# Patient Record
Sex: Female | Born: 1990 | Race: White | Hispanic: No | Marital: Single
Health system: Southern US, Community
[De-identification: ages and names within clinical notes are randomized; demographics above are authoritative.]

---
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
## 2013-01-09 ENCOUNTER — Ambulatory Visit: Payer: BC Managed Care – PPO

## 2013-01-09 ENCOUNTER — Ambulatory Visit (INDEPENDENT_AMBULATORY_CARE_PROVIDER_SITE_OTHER): Payer: BC Managed Care – PPO | Admitting: Family Medicine

## 2013-01-09 VITALS — BP 100/78 | HR 78 | Temp 98.0°F | Resp 16 | Ht 67.0 in | Wt 130.0 lb

## 2013-01-09 DIAGNOSIS — L089 Local infection of the skin and subcutaneous tissue, unspecified: Secondary | ICD-10-CM

## 2013-01-09 DIAGNOSIS — M79661 Pain in right lower leg: Secondary | ICD-10-CM

## 2013-01-09 DIAGNOSIS — S86899A Other injury of other muscle(s) and tendon(s) at lower leg level, unspecified leg, initial encounter: Secondary | ICD-10-CM

## 2013-01-09 DIAGNOSIS — IMO0002 Reserved for concepts with insufficient information to code with codable children: Secondary | ICD-10-CM

## 2013-01-09 DIAGNOSIS — M79609 Pain in unspecified limb: Secondary | ICD-10-CM

## 2013-01-09 MED ORDER — SULFAMETHOXAZOLE-TRIMETHOPRIM 800-160 MG PO TABS: 1.0000 | ORAL_TABLET | Freq: Two times a day (BID) | ORAL | Status: DC

## 2013-01-09 NOTE — Patient Instructions (Addendum)
Medial Tibial Stress Syndrome (Shin Splints) with Rehab Medial tibial stress syndrome is also called shin splints. Shin splints is a term that is broadly used to describe pain in the lower leg. Shin splints most commonly involve inflammation of the bone lining (periostitis). SYMPTOMS   Pain in the front, or more commonly, the inner part of the lower half of the leg (shin), above the ankle.  Pain that first occurs after exercise, and eventually progresses to pain at the beginning of exercise, that decreases after a short warm up period.  With continued exercise and if left untreated, constant pain that eventually causes the athlete to stop playing sports. CAUSES  Shin splints are an overuse injury, in which the bone lining (periosteum) is broken down at a faster rate than it can be repaired. This leads to inflammation of the periosteum and pain.  RISK INCREASES WITH:  Weakness or imbalance of the muscles of the leg and calf.  Poor strength and flexibility. Failure to warm up properly before activity.  Sports that require repetitive loading or running (marathon running, soccer, walking, jogging), especially on uneven ground or hard surfaces (concrete).  Lack of conditioning, early in the season or practice.  Poor running technique.  Flat feet.  Sudden change in activity intensity, frequency, or duration. PREVENTION  Warm up and stretch properly before activity.  Allow for adequate recovery between workouts.  Maintain physical fitness:  Strength, flexibility, and endurance.  Cardiovascular fitness.  Ensure properly fitted and cushioned shoes.  Wear cushioned arch supports.  Learn and use proper technique and have a coach correct improper technique.  Increase activity gradually.  Run on surfaces that absorb shock, such as grass, composite track, or sand (beach). PROGNOSIS  If treated properly with a slow return to activity, shin splints usually heal within 2 to 8 weeks.    RELATED COMPLICATIONS   Recurring symptoms, that result in a chronic problem.  Longer healing time, if not properly treated or if not given enough time to heal.  Altered level of performance or need to end sports participation, due to pain if activity is continued without treatment. TREATMENT Treatment first involves the use of ice and medicine, to reduce pain and inflammation. The use of strengthening and stretching exercises may help reduce pain with activity. These exercises may be performed at home or with a therapist. For individuals with flat feet, the use of arch supports (orthotics) may be helpful. Sometimes, taping, casting, or bracing the leg may be advised. Slow return to activity is allowed after pain is gone. Rarely, surgery is attempted to remove the chronically inflamed tissue.  MEDICATION  If pain medicine is needed, nonsteroidal anti-inflammatory medicines (aspirin and ibuprofen), or other minor pain relievers (acetaminophen), are often advised.  Do not take pain medicine for 7 days before surgery.  Prescription pain relievers may be given, if your caregiver thinks they are needed. Use only as directed and only as much as you need.  Ointments applied to the skin may be helpful. HEAT AND COLD  Cold treatment (icing) should be applied for 10 to 15 minutes every 2 to 3 hours for inflammation and pain, and immediately after activity that aggravates your symptoms. Use ice packs or an ice massage.  Heat treatment may be used before performing stretching and strengthening activities prescribed by your caregiver, physical therapist, or athletic trainer. Use a heat pack or a warm water soak. SEEK MEDICAL CARE IF:   Symptoms get worse or do not improve in 4   Heat treatment may be used before performing stretching and strengthening activities prescribed by your caregiver, physical therapist, or athletic trainer. Use a heat pack or a warm water soak.  SEEK MEDICAL CARE IF:   · Symptoms get worse or do not improve in 4 to 6 weeks, despite treatment.  · New, unexplained symptoms develop. (Drugs used in treatment may produce side effects.)  EXERCISES  RANGE OF MOTION (ROM) AND STRETCHING EXERCISES - Medial Tibial Stress Syndrome (Shin  Splints)  These exercises may help you when beginning to rehabilitate your injury. Your symptoms may resolve with or without further involvement from your physician, physical therapist or athletic trainer. While completing these exercises, remember:   · Restoring tissue flexibility helps normal motion to return to the joints. This allows healthier, less painful movement and activity.  · An effective stretch should be held for at least 30 seconds.  · A stretch should never be painful. You should only feel a gentle lengthening or release in the stretched tissue.  STRETCH  Gastroc, Standing  · Place your hands on a wall.  · Extend your right / left leg behind you, keeping the front knee somewhat bent.  · Slightly point your toes inward on your back foot.  · Keeping your right / left heel on the floor and your knee straight, shift your weight toward the wall, not allowing your back to arch.  · You should feel a gentle stretch in the right / left calf. Hold this position for __________ seconds.  Repeat __________ times. Complete this stretch __________ times per day.  STRETCH  Soleus, Standing   · Place your hands on a wall.  · Extend your right / left leg behind you, keeping the other knee somewhat bent.  · Slightly point your toes inward on your back foot.  · Keep your right / left heel on the floor, bend your back knee, and slightly shift your weight over the back leg so that you feel a gentle stretch deep in your back calf.  · Hold this position for __________ seconds.  Repeat __________ times. Complete this stretch __________ times per day.  STRETCH  Gastrocsoleus, Standing   Note: This exercise can place a lot of stress on your foot and ankle. Please complete this exercise only if specifically instructed by your caregiver.   · Place the ball of your right / left foot on a step, keeping your other foot firmly on the same step.  · Hold on to the wall or a rail for balance.  · Slowly lift your other foot, allowing  your body weight to press your heel down over the edge of the step.  · You should feel a stretch in your right / left calf.  · Hold this position for __________ seconds.  · Repeat this exercise with a slight bend in your right / left knee.  Repeat __________ times. Complete this stretch __________ times per day.   RANGE OF MOTION - Ankle Eversion   · Sit with your right / left ankle crossed over your opposite knee.  · Grip your foot with your opposite hand, placing your thumb on the top of your foot and your fingers across the bottom of your foot.  · Gently push your foot downward with a slight rotation so your littlest toes rise slightly toward the ceiling.  · You should feel a gentle stretch on the inside of your ankle. Hold the stretch for __________ seconds.  Repeat __________ times. Complete   this exercise __________ times per day.   RANGE OF MOTION - Ankle Inversion  · Sit with your right / left ankle crossed over your opposite knee.  · Grip your foot with your opposite hand, placing your thumb on the bottom of your foot and your fingers across the top of your foot.  · Gently pull your foot so the smallest toe comes toward you and your thumb pushes the inside of the ball of your foot away from you.  · You should feel a gentle stretch on the outside of your ankle. Hold the stretch for __________ seconds.  Repeat __________ times. Complete this exercise __________ times per day.   RANGE OF MOTION- Ankle Plantar Flexion   · Sit with your right / left leg crossed over your opposite knee.  · Use your opposite hand to pull the top of your foot and toes toward you.  · You should feel a gentle stretch on the top of your foot and ankle. Hold this position for __________ seconds.  Repeat __________ times. Complete __________ times per day.   STRENGTHENING EXERCISES - Medial Tibial Stress Syndrome (Shin Splints)  These exercises may help you when beginning to rehabilitate your injury. They may resolve your symptoms with  or without further involvement from your physician, physical therapist or athletic trainer. While completing these exercises, remember:   · Muscles can gain both the endurance and the strength needed for everyday activities through controlled exercises.  · Complete these exercises as instructed by your physician, physical therapist or athletic trainer. Increase the resistance and repetitions only as guided by your caregiver.  STRENGTH - Dorsiflexors  · Secure a rubber exercise band or tubing to a fixed object (table, pole) and loop the other end around your right / left foot.  · Sit on the floor facing the fixed object. The band should be slightly tense when your foot is relaxed.  · Slowly draw your foot back toward you, using your ankle and toes.  · Hold this position for __________ seconds. Slowly release the tension in the band, return your foot to the starting position.  Repeat __________ times. Complete this exercise __________ times per day.   STRENGTH - Towel Curls  · Sit in a chair, on a non-carpeted surface.  · Place your foot on a towel, keeping your heel on the floor.  · Pull the towel toward your heel only by curling your toes. Keep your heel on the floor.  · If instructed by your physician, physical therapist or athletic trainer, you may add weight at the end of the towel.  Repeat __________ times. Complete this exercise __________ times per day.  STRENGTH - Ankle Inversion  · Secure one end of a rubber exercise band or tubing to a fixed object (table, pole). Loop the other end around your foot, just before your toes.  · Place your fists between your knees. This will focus your strengthening at your ankle.  · Slowly, pull your big toe up and in, making sure the band is positioned to resist the entire motion.  · Hold this position for __________ seconds.  · Have your muscles resist the band, as it slowly pulls your foot back to the starting position.  Repeat __________ times. Complete this exercises  __________ times per day.   Document Released: 04/29/2005 Document Revised: 07/22/2011 Document Reviewed: 08/11/2008  Exit[employer]® Patient Information ©2014 [employer].
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
## 2013-05-14 ENCOUNTER — Ambulatory Visit (INDEPENDENT_AMBULATORY_CARE_PROVIDER_SITE_OTHER): Payer: BC Managed Care – PPO | Admitting: Family Medicine

## 2013-05-14 ENCOUNTER — Encounter: Payer: Self-pay | Admitting: Family Medicine

## 2013-05-14 VITALS — BP 110/62 | HR 75 | Temp 97.9°F | Resp 16 | Ht 66.0 in | Wt 122.6 lb

## 2013-05-14 DIAGNOSIS — M6740 Ganglion, unspecified site: Secondary | ICD-10-CM

## 2013-05-14 DIAGNOSIS — M67431 Ganglion, right wrist: Secondary | ICD-10-CM

## 2013-05-14 NOTE — Progress Notes (Signed)
   Subjective:    Patient ID: Allison Haney, female    DOB: 11/18/1990, 23 y.o.   MRN: [id number] Chief Complaint  Patient presents with  . knot in right hand 05/10/13    HPI  Several days ago noticed a little knot on her wrist that had just popped up. Her mom suspected it was a ganglion cyst but w/in an hour it had doubled in size.  Since then - over the past 4d, unchanged. No pain or discomfort. No restriction in wrist use or ROM. Plays soccer, track/cross country - does do occ weight lifting/push-ups but other than that no sig strenuous use.  Has never had anything similar prior.  Did not get flu shot this yr, declines today.  Home from college on winter break - brought in by mom.  No past medical history on file. No current outpatient prescriptions on file prior to visit.   No current facility-administered medications on file prior to visit.   No Known Allergies  Review of Systems  Constitutional: Negative for fever, chills and activity change.  Musculoskeletal: Positive for joint swelling. Negative for arthralgias, back pain, gait problem and myalgias.  Skin: Negative for color change, pallor, rash and wound.  Neurological: Negative for weakness and numbness.  Hematological: Negative for adenopathy. Does not bruise/bleed easily.      BP 110/62  Pulse 75  Temp(Src) 97.9 F (36.6 C) (Oral)  Resp 16  Ht 5\' 6"  (1.676 m)  Wt 122 lb 9.6 oz (55.611 kg)  BMI 19.80 kg/m2  SpO2 100%  LMP 04/19/2013 Objective:   Physical Exam  Constitutional: She is oriented to person, place, and time. She appears well-developed and well-nourished. No distress.  HENT:  Head: Normocephalic and atraumatic.  Right Ear: External ear normal.  Eyes: Conjunctivae are normal. No scleral icterus.  Pulmonary/Chest: Effort normal.  Musculoskeletal:       Right wrist: She exhibits swelling, effusion and deformity. She exhibits normal range of motion, no tenderness, no bony tenderness and no crepitus.      Right forearm: Normal.       Right hand: Normal.  On radial aspect of extensor wrist has soft compressible 1 cm mass, non-mobile, non-tender, no overlying skin changes, no warmth or induration.  Neurological: She is alert and oriented to person, place, and time.  Skin: Skin is warm and dry. She is not diaphoretic. No erythema.  Psychiatric: She has a normal mood and affect. Her behavior is normal.       Assessment & Plan:  Ganglion cyst of wrist, right Reviewed treatment options of watchful waiting - may go away on its own.  Try ice and wearing wrist brace if lifting weights or doing push-ups for compression.  If increases in size, causes any pain, or starts to limit wrist ROM rec RTC for aspiration and cons steroid inj. If bothers her at school ok to call and we could always try to get referral to hand surgeon in her area.  Pt agrees w/ conservative management for now since just started and asymptomatic - watchful waiting.   Norberto SorensonEva Ermin Parisien, MD MPH

## 2013-05-14 NOTE — Patient Instructions (Signed)
Ganglion Cyst °A ganglion cyst is a noncancerous, fluid-filled lump that occurs near joints or tendons. The ganglion cyst grows out of a joint or the lining of a tendon. It most often develops in the hand or wrist but can also develop in the shoulder, elbow, hip, knee, ankle, or foot. The round or oval ganglion can be pea sized or larger than a grape. Increased activity may enlarge the size of the cyst because more fluid starts to build up.  °CAUSES  °It is not completely known what causes a ganglion cyst to grow. However, it may be related to: °· Inflammation or irritation around the joint. °· An injury. °· Repetitive movements or overuse. °· Arthritis. °SYMPTOMS  °A lump most often appears in the hand or wrist, but can occur in other areas of the body. Generally, the lump is painless without other symptoms. However, sometimes pain can be felt during activity or when pressure is applied to the lump. The lump may even be tender to the touch. Tingling, pain, numbness, or muscle weakness can occur if the ganglion cyst presses on a nerve. Your grip may be weak and you may have less movement in your joints.  °DIAGNOSIS  °Ganglion cysts are most often diagnosed based on a physical exam, noting where the cyst is and how it looks. Your caregiver will feel the lump and may shine a light alongside it. If it is a ganglion, a light often shines through it. Your caregiver may order an X-ray, ultrasound, or MRI to rule out other conditions. °TREATMENT  °Ganglions usually go away on their own without treatment. If pain or other symptoms are involved, treatment may be needed. Treatment is also needed if the ganglion limits your movement or if it gets infected. Treatment options include: °· Wearing a wrist or finger brace or splint. °· Taking anti-inflammatory medicine. °· Draining fluid from the lump with a needle (aspiration). °· Injecting a steroid into the joint. °· Surgery to remove the ganglion cyst and its stalk that is  attached to the joint or tendon. However, ganglion cysts can grow back. °HOME CARE INSTRUCTIONS  °· Do not press on the ganglion, poke it with a needle, or hit it with a heavy object. You may rub the lump gently and often. Sometimes fluid moves out of the cyst. °· Only take medicines as directed by your caregiver. °· Wear your brace or splint as directed by your caregiver. °SEEK MEDICAL CARE IF:  °· Your ganglion becomes larger or more painful. °· You have increased redness, red streaks, or swelling. °· You have pus coming from the lump. °· You have weakness or numbness in the affected area. °MAKE SURE YOU:  °· Understand these instructions. °· Will watch your condition. °· Will get help right away if you are not doing well or get worse. °Document Released: 04/26/2000 Document Revised: 01/22/2012 Document Reviewed: 06/23/2007 °Exit[employer]® Patient Information ©2014 [employer]. ° °

## 2014-04-13 IMAGING — CR DG TIBIA/FIBULA 2V*R*
2 series · 2 of 2 positions shown · non-contrast
Comparison: None

CLINICAL DATA: Leg injury

RIGHT TIBIA AND FIBULA - 2 VIEW

[AP]
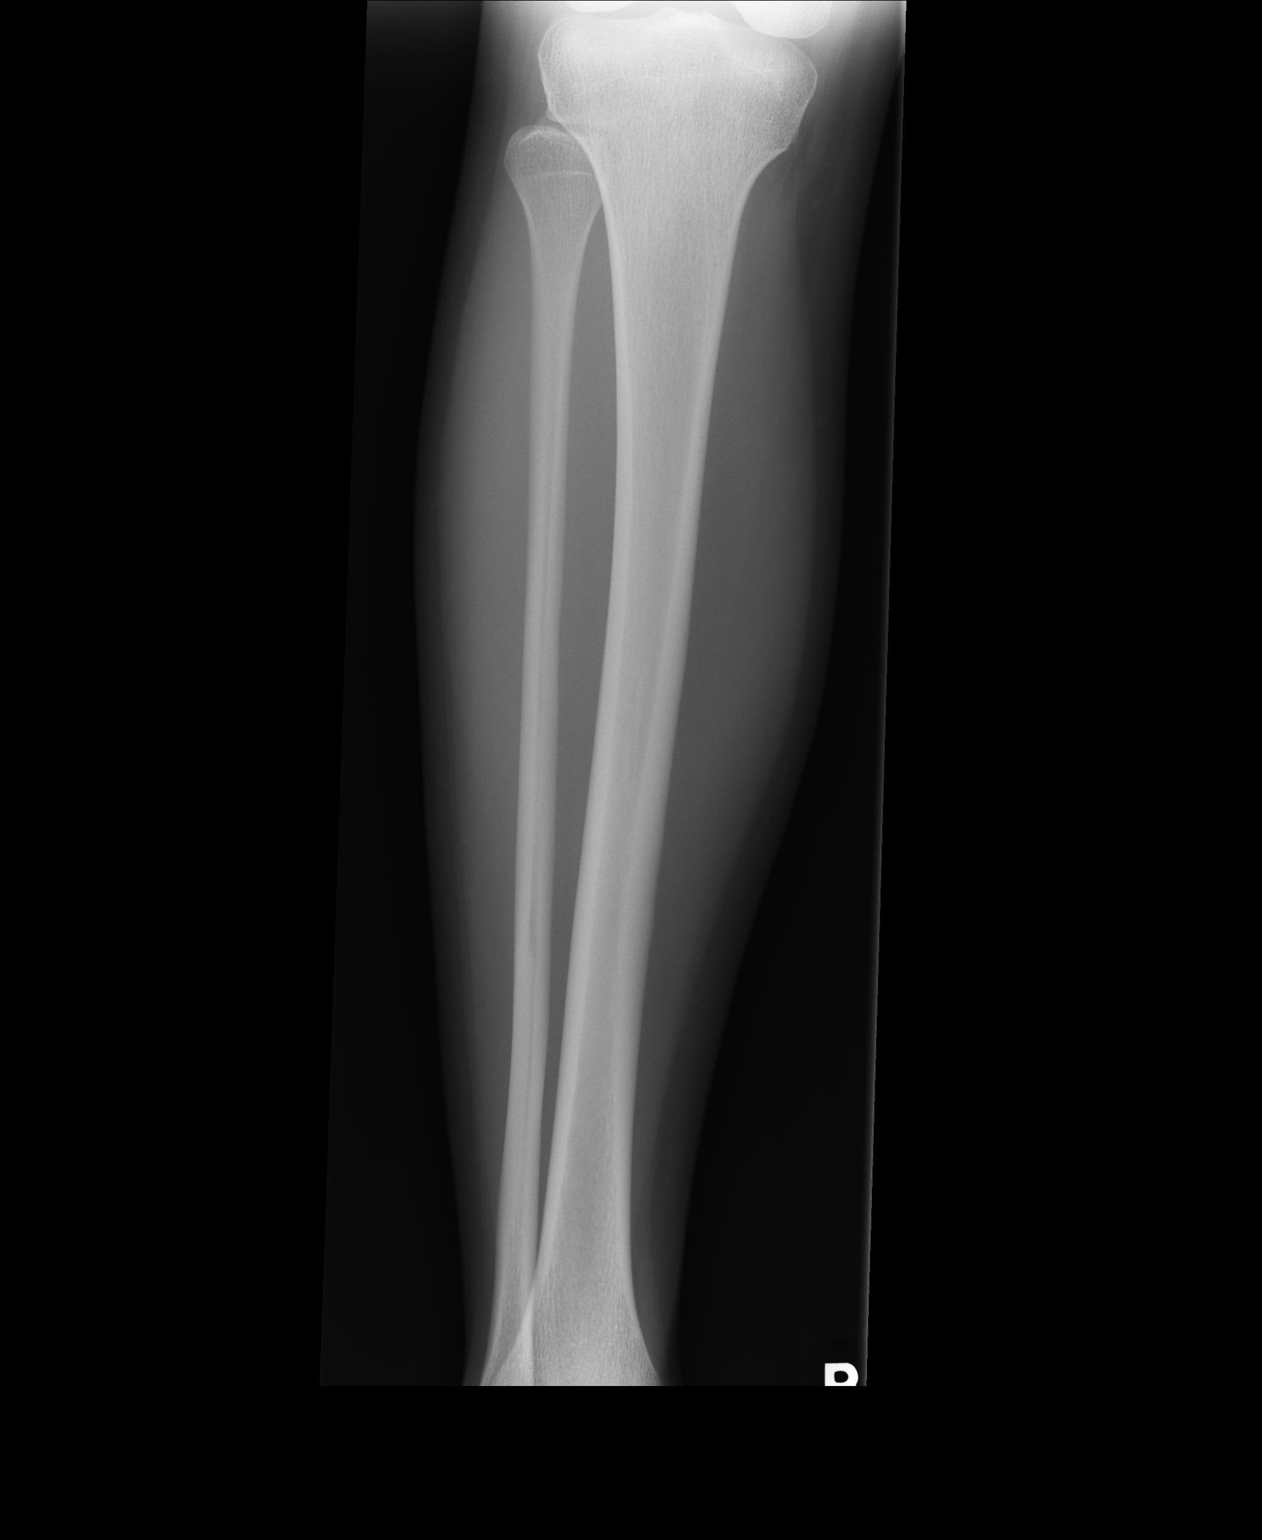

[lateral]
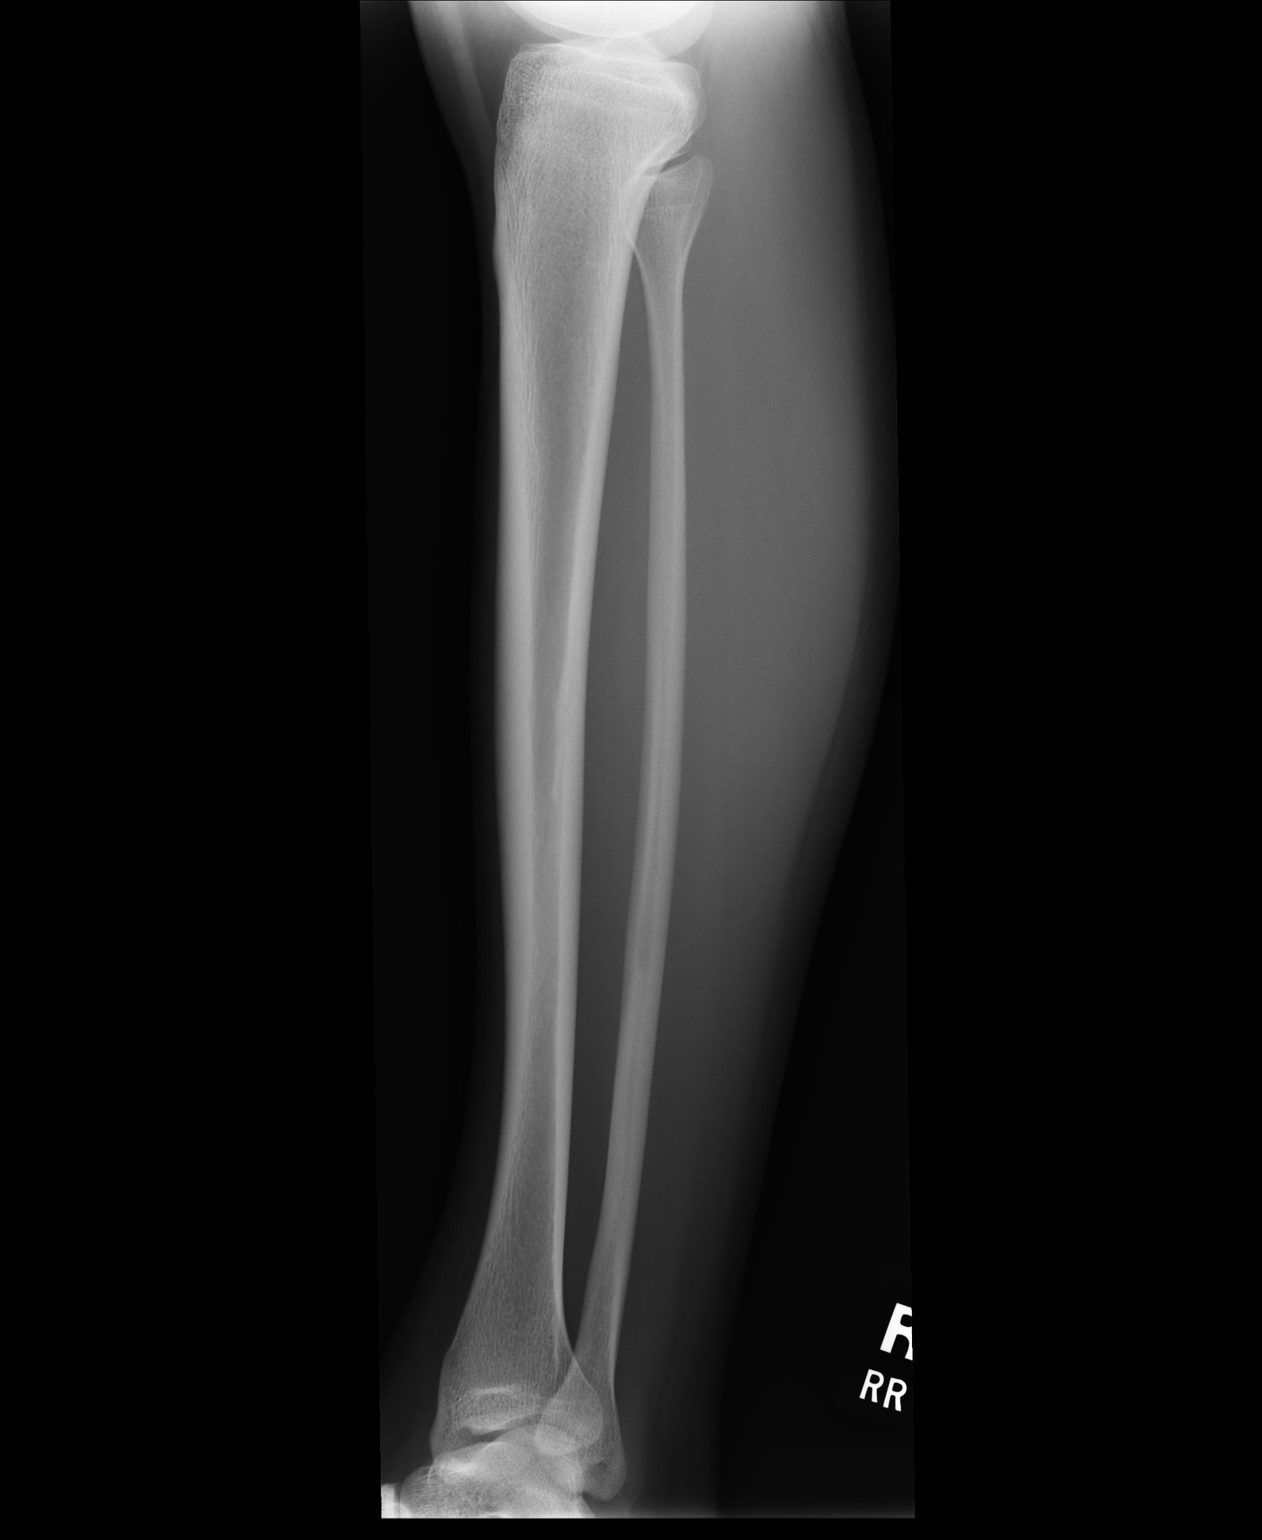

[2 of 2 positions shown; findings below may reference images not displayed]

FINDINGS: Negative for fracture.  No bony lesion.  Negative for
foreign body.
IMPRESSION: Negative

Clinically significant discrepancy from primary report, if
provided: None
